# Patient Record
Sex: Male | Born: 1975 | Hispanic: Yes | Marital: Married | Smoking: Never smoker
Health system: Southern US, Community
[De-identification: ages and names within clinical notes are randomized; demographics above are authoritative.]

---

## 2009-11-24 ENCOUNTER — Encounter: Admission: RE | Admit: 2009-11-24 | Discharge: 2009-11-24 | Payer: Self-pay | Admitting: Internal Medicine

## 2010-05-18 ENCOUNTER — Encounter: Admission: RE | Admit: 2010-05-18 | Discharge: 2010-05-18 | Payer: Self-pay | Admitting: Internal Medicine

## 2010-07-01 ENCOUNTER — Encounter: Admission: RE | Admit: 2010-07-01 | Discharge: 2010-07-01 | Payer: Self-pay | Admitting: Urology

## 2012-04-03 IMAGING — CT CT ABD-PEL WO/W CM
2 of 7 series · 14 of 46 positions shown, 19 images · IV contrast ([ID] h2o & OMNI 300/[ID])
Comparison: Abdominal ultrasound 05/18/2010.

CLINICAL DATA: Intermittent hematuria over the past 2 years with
recent recurrence. Mild right flank pain.

CT ABDOMEN AND PELVIS WITHOUT AND WITH CONTRAST 07/01/2010:
TECHNIQUE: Multidetector CT imaging of the abdomen and pelvis was
performed without contrast material in one or both body regions,
followed by contrast material(s) and further sections in one or
both body regions.  Enhanced images were obtained with the patient
prone.
Contrast: 150 ml Omnipaque 300 IV.

[Series 5: enhanced/prone · axial · 0.70mm/px · z∈[-378,-48]mm · 11 of 78 slices shown, 16 images]
[im 6/78  soft-tissue]
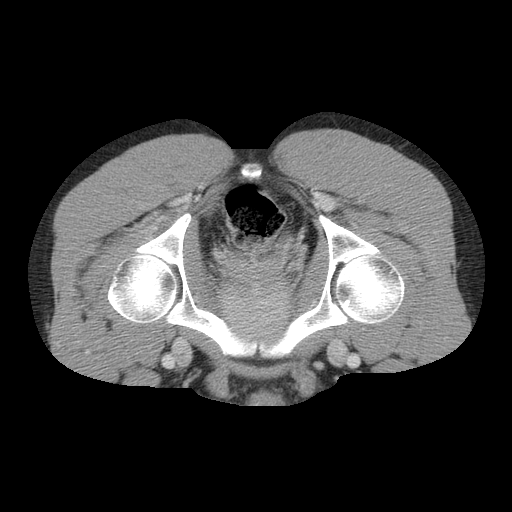
[im 6/78  bone]
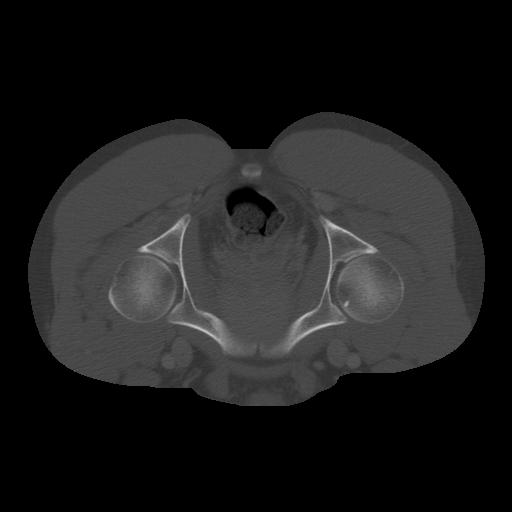
[im 16/78  soft-tissue]
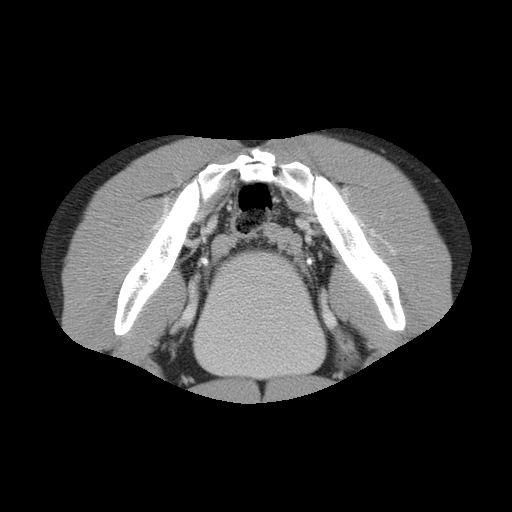
[im 21/78  soft-tissue]
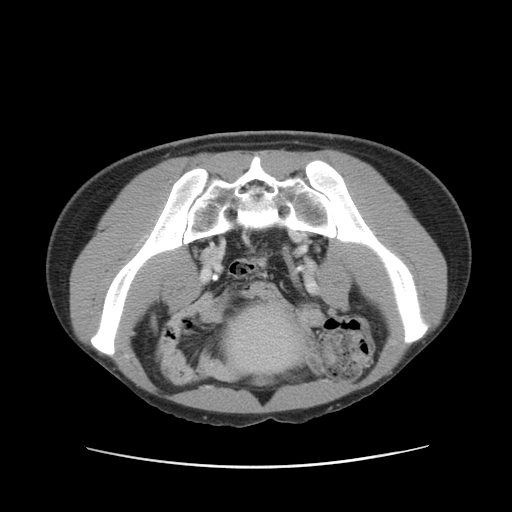
[im 26/78  soft-tissue]
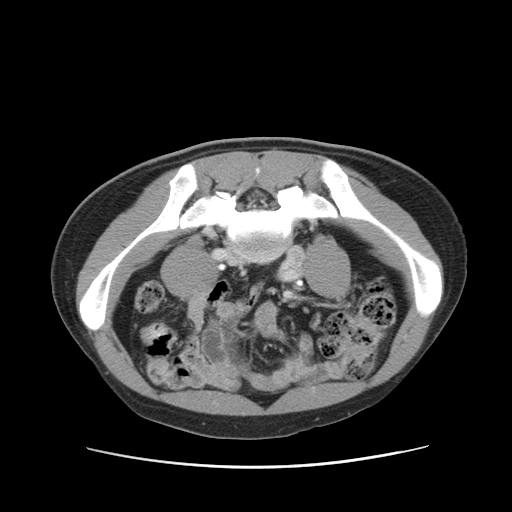
[im 36/78  soft-tissue]
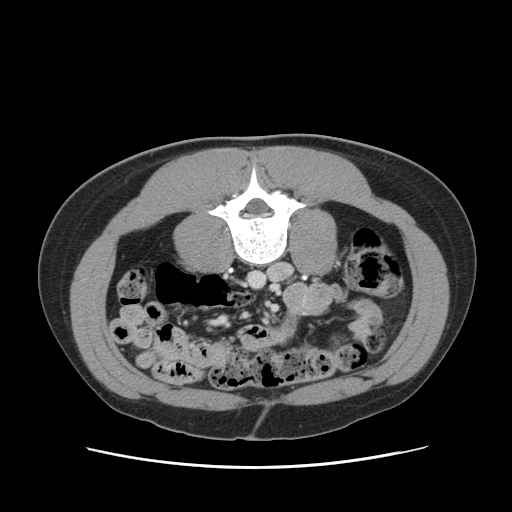
[im 42/78  soft-tissue]
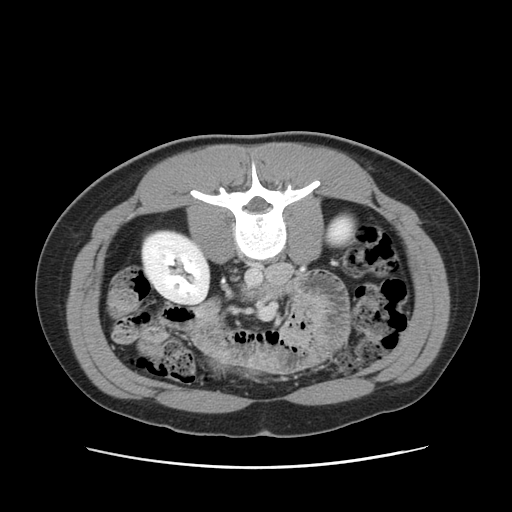
[im 52/78  soft-tissue]
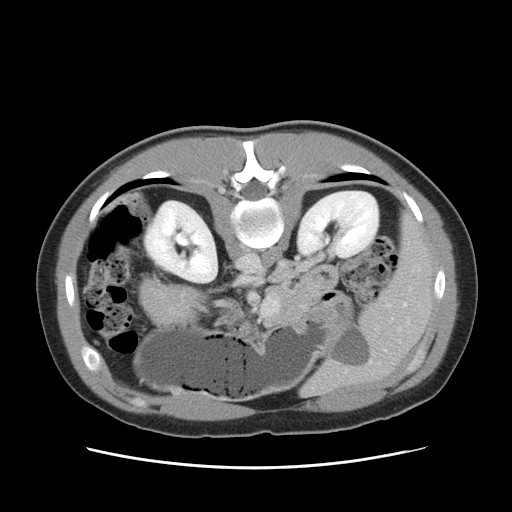
[im 57/78  soft-tissue]
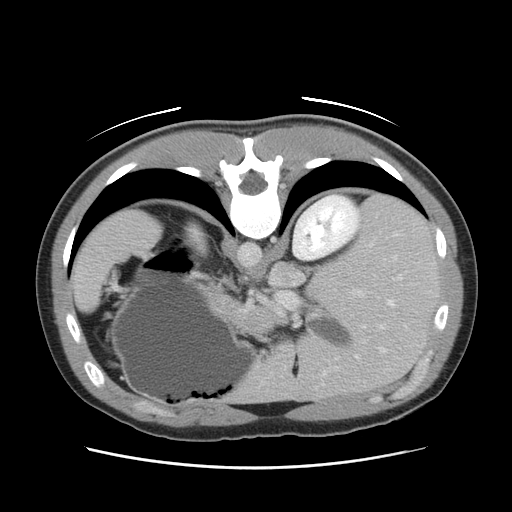
[im 57/78  lung]
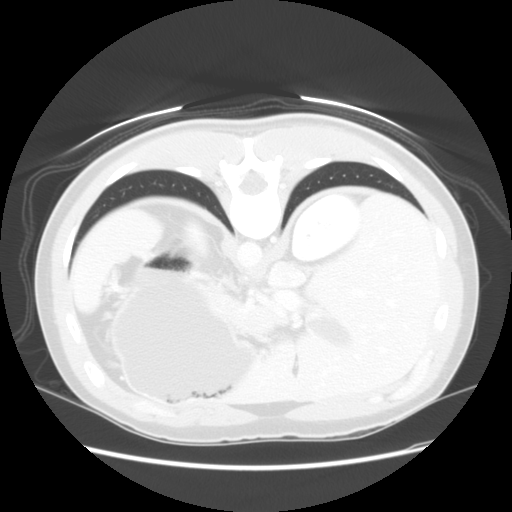
[im 62/78  soft-tissue]
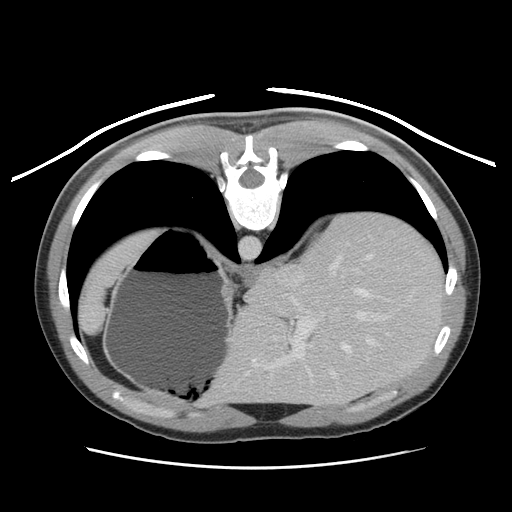
[im 62/78  lung]
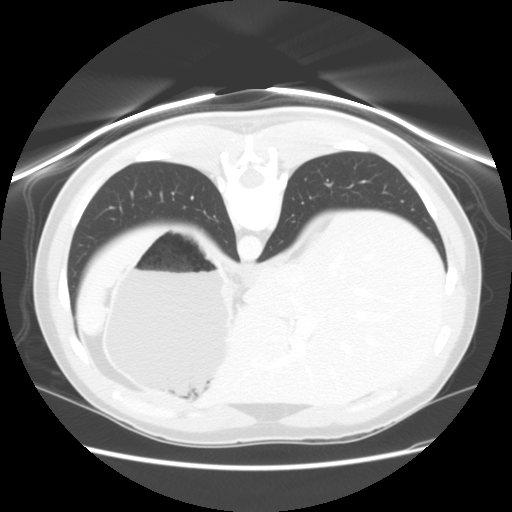
[im 62/78  bone]
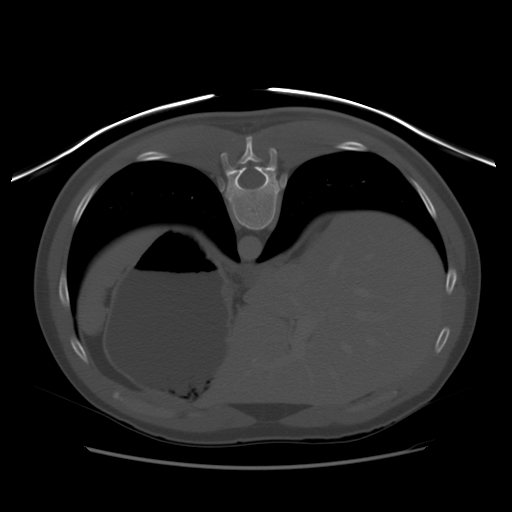
[im 67/78  lung]
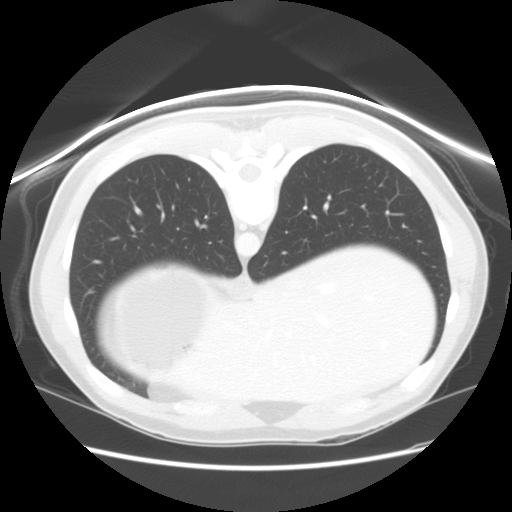
[im 72/78  soft-tissue]
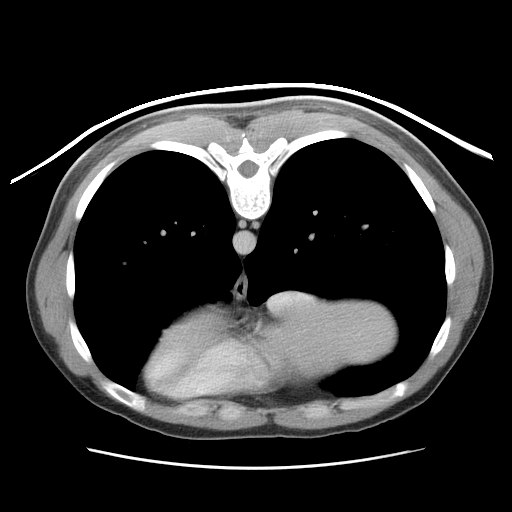
[im 72/78  lung]
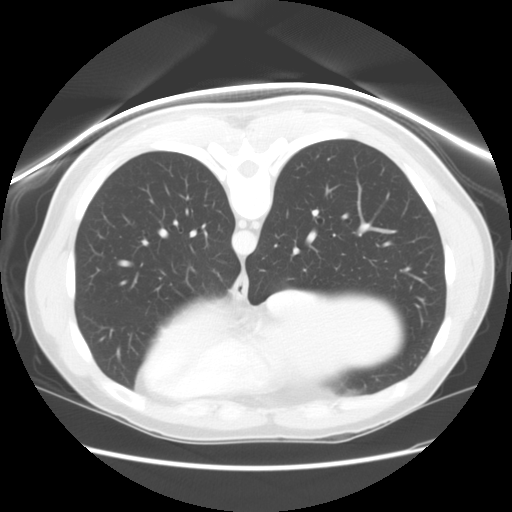

[Series 301: cor w/o · coronal · non-contrast · 0.70mm/px · 3 of 98 slices shown]
[im 25/98  soft-tissue]
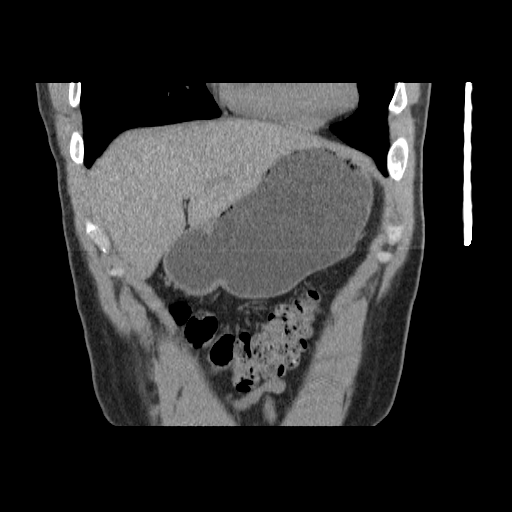
[im 49/98  soft-tissue]
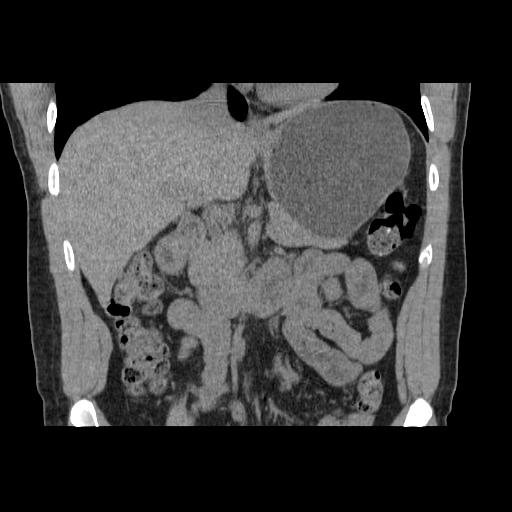
[im 73/98  soft-tissue]
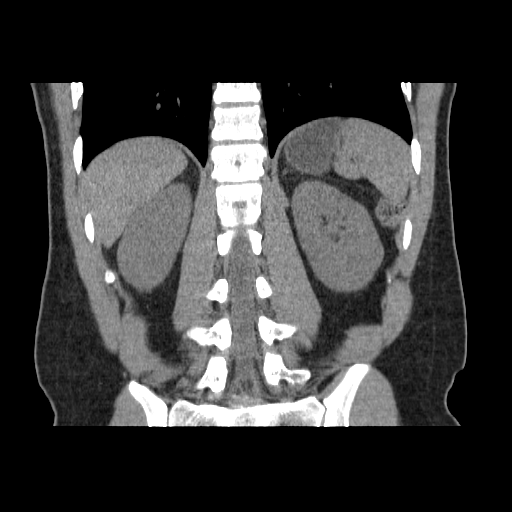

[14 of 46 positions shown; findings below may reference images not displayed]

FINDINGS: Unenhanced images of the abdomen demonstrate no upper
urinary tract calculi on either side.  No abnormal calcifications
were identified elsewhere.

Enhanced images demonstrate normal appearing kidneys bilaterally
without focal parenchymal abnormalities.  Urinary bladder normal in
appearance.

Normal appearing liver, spleen, pancreas, and adrenal glands.
Gallbladder unremarkable by CT.  No biliary ductal dilation.
Stomach, small bowel, and colon unremarkable. Normal appearing long
appendix identified in the right upper and mid pelvis.  No ascites.
No visible aorto-iliofemoral atherosclerosis.  No significant
lymphadenopathy.  Circumaortic left renal vein noted.

Prostate gland normal in appearance.  Small bilateral seminal
vesicle cysts. Visualized lung bases clear.  Bone window images
unremarkable.
IMPRESSION: 1.  No evidence of urinary tract calculi or obstruction on either
side.  No abnormalities identified to explain hematuria.  Normal-
appearing kidneys and urinary bladder.
2.  No acute or significant abnormality involving the abdomen or
pelvis.

## 2013-03-20 ENCOUNTER — Encounter (HOSPITAL_COMMUNITY): Payer: Self-pay | Admitting: *Deleted

## 2013-03-20 DIAGNOSIS — K219 Gastro-esophageal reflux disease without esophagitis: Secondary | ICD-10-CM | POA: Insufficient documentation

## 2013-03-20 NOTE — ED Notes (Addendum)
Pt states last night he had burning after he ate. Pt states that he has pain in his lower center chest, near stomach area. Pt states first time this pain has ever happened. Pt denies lifting anything heavy. Pt denies eating new foods. Pt states when he sneezes, coughs or takes a deep breath the pain gets worse.

## 2013-03-21 ENCOUNTER — Emergency Department (HOSPITAL_COMMUNITY)
Admission: EM | Admit: 2013-03-21 | Discharge: 2013-03-21 | Disposition: A | Discharge status: Home / Self Care | Payer: Self-pay | Attending: Emergency Medicine | Admitting: Emergency Medicine

## 2013-03-21 ENCOUNTER — Emergency Department (HOSPITAL_COMMUNITY)
Admit: 2013-03-21 | Discharge: 2013-03-21 | Disposition: A | Discharge status: Home / Self Care | Payer: Self-pay | Attending: Emergency Medicine | Admitting: Emergency Medicine

## 2013-03-21 DIAGNOSIS — K219 Gastro-esophageal reflux disease without esophagitis: Secondary | ICD-10-CM

## 2013-03-21 LAB — CBC WITH DIFFERENTIAL/PLATELET
Basophils Absolute: 0 10*3/uL (ref 0.0–0.1)
Eosinophils Absolute: 0.3 10*3/uL (ref 0.0–0.7)
Eosinophils Relative: 5 % (ref 0–5)
MCH: 30.7 pg (ref 26.0–34.0)
MCV: 88.2 fL (ref 78.0–100.0)
Platelets: 243 10*3/uL (ref 150–400)
RDW: 13.8 % (ref 11.5–15.5)
WBC: 5.2 10*3/uL (ref 4.0–10.5)

## 2013-03-21 LAB — COMPREHENSIVE METABOLIC PANEL
ALT: 53 U/L (ref 0–53)
AST: 65 U/L — ABNORMAL HIGH (ref 0–37)
Albumin: 3.3 g/dL — ABNORMAL LOW (ref 3.5–5.2)
Calcium: 8.9 mg/dL (ref 8.4–10.5)
GFR calc Af Amer: 74 mL/min — ABNORMAL LOW (ref >90)
Sodium: 138 mEq/L (ref 135–145)
Total Protein: 7.1 g/dL (ref 6.0–8.3)

## 2013-03-21 MED ORDER — GI COCKTAIL ~~LOC~~
30.0000 mL | Freq: Once | ORAL | Status: AC
  Administered 2013-03-21: 30 mL via ORAL
  Filled 2013-03-21: qty 30

## 2013-03-21 MED ORDER — SUCRALFATE 1 GM/10ML PO SUSP: 1.0000 g | Freq: Four times a day (QID) | ORAL | Status: AC

## 2013-03-21 MED ORDER — OMEPRAZOLE 20 MG PO CPDR: 20.0000 mg | DELAYED_RELEASE_CAPSULE | Freq: Every day | ORAL | Status: AC

## 2013-03-21 NOTE — ED Notes (Signed)
Pt states understanding of discharge instructions 

## 2013-03-21 NOTE — ED Provider Notes (Signed)
History     CSN: [id number]  Arrival date & time 03/20/13  2341   First MD Initiated Contact with Patient 03/21/13 [phone]      Chief Complaint  Patient presents with  . Chest Pain    (Consider location/radiation/quality/duration/timing/severity/associated sxs/prior treatment) Patient is a 37 y.o. male presenting with chest pain. The history is provided by the patient.  Chest Pain Pain location:  Epigastric Pain quality: burning   Pain radiates to:  Does not radiate Pain radiates to the back: no   Pain severity:  Moderate Onset quality:  Gradual Duration:  36 hours Timing:  Constant Progression:  Unchanged Chronicity:  New Context: at rest   Relieved by:  Nothing Worsened by:  Nothing tried Ineffective treatments:  None tried Associated symptoms: no fever, no palpitations and no shortness of breath   Risk factors: male sex     History reviewed. No pertinent past medical history.  History reviewed. No pertinent past surgical history.  History reviewed. No pertinent family history.  History  Substance Use Topics  . Smoking status: Never Smoker   . Smokeless tobacco: Not on file  . Alcohol Use: No      Review of Systems  Constitutional: Negative for fever.  Respiratory: Negative for shortness of breath.   Cardiovascular: Positive for chest pain. Negative for palpitations and leg swelling.  All other systems reviewed and are negative.    Allergies  Review of patient's allergies indicates no known allergies.  Home Medications  No current outpatient prescriptions on file.  BP 126/81  Pulse 86  Resp 16  SpO2 98%  Physical Exam  Constitutional: He is oriented to person, place, and time. He appears well-developed and well-nourished. No distress.  HENT:  Head: Normocephalic and atraumatic.  Mouth/Throat: Oropharynx is clear and moist.  Eyes: Conjunctivae are normal. Pupils are equal, round, and reactive to light.  Neck: Normal range of motion. Neck  supple.  Cardiovascular: Normal rate, regular rhythm and intact distal pulses.   Pulmonary/Chest: Effort normal and breath sounds normal. He has no wheezes. He has no rales.  Abdominal: Soft. Bowel sounds are increased. There is no tenderness. There is no rebound and no guarding.  Musculoskeletal: Normal range of motion. He exhibits no edema.  Neurological: He is alert and oriented to person, place, and time.  Skin: Skin is warm and dry. He is not diaphoretic.  Psychiatric: He has a normal mood and affect.    ED Course  Procedures (including critical care time)  Labs Reviewed  COMPREHENSIVE METABOLIC PANEL - Abnormal; Notable for the following:    Creatinine, Ser 1.40 (*)    Albumin 3.3 (*)    AST 65 (*)    Total Bilirubin 0.2 (*)    GFR calc non Af Amer 64 (*)    GFR calc Af Amer 74 (*)    All other components within normal limits  CBC WITH DIFFERENTIAL  POCT I-STAT TROPONIN I   Dg Chest 2 View  03/21/2013   *RADIOLOGY REPORT*  Clinical Data: Chest and epigastric pain.  CHEST - 2 VIEW  Comparison:  None.  Findings:  The heart size and mediastinal contours are within normal limits.  Both lungs are clear.  The visualized skeletal structures are unremarkable.  IMPRESSION: No active cardiopulmonary disease.   Original Report Authenticated By: Myles Rosenthal, M.D.     No diagnosis found.  PERC and wells negative   MDM   Date: 03/21/2013  Rate: 86  Rhythm: normal sinus rhythm  QRS Axis: normal  Intervals: normal  ST/T Wave abnormalities: normal  Conduction Disutrbances: none  Narrative Interpretation: unremarkable    Negative EKG and negative troponin in the setting of > 8 hours of symptoms excluded ACS.  Sx consistent with gerd       Joel Mericle Smitty Cords, MD 03/21/13 [phone]

## 2016-12-22 ENCOUNTER — Other Ambulatory Visit: Payer: Self-pay | Admitting: Internal Medicine

## 2016-12-22 ENCOUNTER — Other Ambulatory Visit: Payer: Self-pay | Admitting: Family

## 2016-12-22 DIAGNOSIS — R109 Unspecified abdominal pain: Secondary | ICD-10-CM

## 2016-12-26 ENCOUNTER — Ambulatory Visit (INDEPENDENT_AMBULATORY_CARE_PROVIDER_SITE_OTHER): Payer: BLUE CROSS/BLUE SHIELD

## 2016-12-26 DIAGNOSIS — K5900 Constipation, unspecified: Secondary | ICD-10-CM | POA: Diagnosis not present

## 2016-12-26 DIAGNOSIS — R109 Unspecified abdominal pain: Secondary | ICD-10-CM

## 2017-12-04 ENCOUNTER — Other Ambulatory Visit: Payer: Self-pay

## 2017-12-04 ENCOUNTER — Emergency Department (INDEPENDENT_AMBULATORY_CARE_PROVIDER_SITE_OTHER)
Admission: EM | Admit: 2017-12-04 | Discharge: 2017-12-04 | Disposition: A | Discharge status: Home / Self Care | Payer: BLUE CROSS/BLUE SHIELD | Source: Home / Self Care | Attending: Family Medicine | Admitting: Family Medicine

## 2017-12-04 DIAGNOSIS — R6889 Other general symptoms and signs: Secondary | ICD-10-CM

## 2017-12-04 DIAGNOSIS — Z20828 Contact with and (suspected) exposure to other viral communicable diseases: Secondary | ICD-10-CM

## 2017-12-04 MED ORDER — OSELTAMIVIR PHOSPHATE 75 MG PO CAPS: 75.0000 mg | ORAL_CAPSULE | Freq: Two times a day (BID) | ORAL | 0 refills | Status: AC

## 2017-12-04 MED ORDER — BENZONATATE 100 MG PO CAPS: 100.0000 mg | ORAL_CAPSULE | Freq: Three times a day (TID) | ORAL | 0 refills | Status: AC

## 2017-12-04 NOTE — Discharge Instructions (Signed)
°  You may take 500mg acetaminophen every 4-6 hours or in combination with ibuprofen 400-600mg every 6-8 hours as needed for pain, inflammation, and fever. ° °Be sure to drink at least eight 8oz glasses of water to stay well hydrated and get at least 8 hours of sleep at night, preferably more while sick.  ° °Oseltamivir (Tamiflu) may cause stomach upset including nausea, vomiting and diarrhea.  It may also cause dizziness or hallucinations in children.  To help prevent stomach upset, you may take this medication with food.  If you are still having unwanted symptoms, you may stop taking this medication as it is not as important to finish the entire course like antibiotics.  If you have questions/concerns please call our office or follow up with your primary care provider.   ° °

## 2017-12-04 NOTE — ED Triage Notes (Signed)
Started Friday with cough and sore throat.  Fever Friday night, as high as 101.9.

## 2017-12-04 NOTE — ED Provider Notes (Signed)
[facility]    CSN: [id number] Arrival date & time: 12/04/17  1219     History   Chief Complaint Chief Complaint  Patient presents with  . Cough  . Sore Throat  . Fever    HPI Randall Kim is a 42 y.o. male.   HPI  Randall Kim is a 42 y.o. male presenting to UC with c/o 2-3 days of mildly productive cough, sore throat, body aches and fever Tmax 101.9*F.  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 Pt did receive the flu vaccine this season.  He has been taking OTC cough/cold medications with temporary relief.  Denies n/v/d. His wife is also in UC to be seen for similar symptoms but pt's symptoms started first.  No chest pain or SOB.   History reviewed. No pertinent past medical history.  There are no active problems to display for this patient.   History reviewed. No pertinent surgical history.     Home Medications    Prior to Admission medications   Medication Sig Start Date End Date Taking? Authorizing Provider  benzonatate (TESSALON) 100 MG capsule Take 1-2 capsules (100-200 mg total) by mouth every 8 (eight) hours. 12/04/17   Lurene Shadow, PA-C  calcium carbonate (TUMS - DOSED IN MG ELEMENTAL CALCIUM) 500 MG chewable tablet Chew 2-3 tablets by mouth 3 (three) times daily as needed for heartburn.    [provider]  omeprazole (PRILOSEC) 20 MG capsule Take 1 capsule (20 mg total) by mouth daily. 03/21/13   Palumbo, April, MD  oseltamivir (TAMIFLU) 75 MG capsule Take 1 capsule (75 mg total) by mouth every 12 (twelve) hours. 12/04/17   Lurene Shadow, PA-C  sucralfate (CARAFATE) 1 GM/10ML suspension Take 10 mLs (1 g total) by mouth 4 (four) times daily. 03/21/13   Palumbo, April, MD    Family History History reviewed. No pertinent family history.  Social History Social History   Tobacco Use  . Smoking status: Never Smoker  Substance Use Topics  . Alcohol use: No  . Drug use: No     Allergies     Fruit & vegetable daily [nutritional supplements]   Review of Systems Review of Systems  Constitutional: Positive for chills, fatigue and fever.  HENT: Positive for congestion and sore throat. Negative for ear pain, trouble swallowing and voice change.   Respiratory: Positive for cough. Negative for shortness of breath.   Cardiovascular: Negative for chest pain and palpitations.  Gastrointestinal: Negative for abdominal pain, diarrhea, nausea and vomiting.  Musculoskeletal: Negative for arthralgias, back pain and myalgias.  Skin: Negative for rash.  Neurological: Positive for headaches. Negative for dizziness and light-headedness.     Physical Exam Triage Vital Signs ED Triage Vitals  Enc Vitals Group     BP 12/04/17 1258 124/82     Pulse Rate 12/04/17 1258 93     Resp --      Temp 12/04/17 1258 99.2 F (37.3 C)     Temp Source 12/04/17 1258 Oral     SpO2 12/04/17 1258 96 %     Weight 12/04/17 1307 169 lb (76.7 kg)     Height 12/04/17 1307 5\' 6"  (1.676 m)     Head Circumference --      Peak Flow --      Pain Score 12/04/17 1306 0     Pain Loc --      Pain Edu? --  Excl. in GC? --    No data found.  Updated Vital Signs BP 124/82 (BP Location: Right Arm)   Pulse 93   Temp 99.2 F (37.3 C) (Oral)   Ht 5\' 6"  (1.676 m)   Wt 169 lb (76.7 kg)   SpO2 96%   BMI 27.28 kg/m   Visual Acuity Right Eye Distance:   Left Eye Distance:   Bilateral Distance:    Right Eye Near:   Left Eye Near:    Bilateral Near:     Physical Exam  Constitutional: He is oriented to person, place, and time. He appears well-developed and well-nourished.  Non-toxic appearance. He does not appear ill. No distress.  HENT:  Head: Normocephalic and atraumatic.  Right Ear: Tympanic membrane normal.  Left Ear: Tympanic membrane normal.  Nose: Mucosal edema present. Right sinus exhibits no maxillary sinus tenderness and no frontal sinus tenderness. Left sinus exhibits no maxillary sinus  tenderness and no frontal sinus tenderness.  Mouth/Throat: Uvula is midline, oropharynx is clear and moist and mucous membranes are normal.  Eyes: EOM are normal.  Neck: Normal range of motion. Neck supple.  Cardiovascular: Normal rate and regular rhythm.  Pulmonary/Chest: Effort normal and breath sounds normal. No stridor. No respiratory distress. He has no wheezes. He has no rhonchi. He has no rales. He exhibits no tenderness.  Musculoskeletal: Normal range of motion.  Lymphadenopathy:    He has no cervical adenopathy.  Neurological: He is alert and oriented to person, place, and time.  Skin: Skin is warm and dry.  Psychiatric: He has a normal mood and affect. His behavior is normal.  Nursing note and vitals reviewed.    UC Treatments / Results  Labs (all labs ordered are listed, but only abnormal results are displayed) Labs Reviewed - No data to display  EKG  EKG Interpretation None       Radiology No results found.  Procedures Procedures (including critical care time)  Medications Ordered in UC Medications - No data to display   Initial Impression / Assessment and Plan / UC Course  I have reviewed the triage vital signs and the nursing notes.  Pertinent labs & imaging results that were available during my care of the patient were reviewed by me and considered in my medical decision making (see chart for details).     Hx and exam c/w influenza w/o evidence of underlying bacterial infection Encouraged fluids and rest Continue OTC medications for symptomatic treatment  Along with starting Tamiflu today F/u with PCP in 1 week if needed Work note provided for pt to return Thursday, 2/7.  Final Clinical Impressions(s) / UC Diagnoses   Final diagnoses:  Flu-like symptoms  Exposure to the flu    ED Discharge Orders        Ordered    oseltamivir (TAMIFLU) 75 MG capsule  Every 12 hours     12/04/17 1316    benzonatate (TESSALON) 100 MG capsule  Every 8 hours      12/04/17 1316       Controlled Substance Prescriptions [place] Controlled Substance Registry consulted? Not Applicable   Rolla Platehelps, Annalicia Renfrew O, PA-C 12/04/17 1404

## 2018-09-29 IMAGING — CT CT RENAL STONE PROTOCOL
2 of 4 series · 15 of 46 positions shown, 17 images · non-contrast
Comparison: CT scan 07/01/2010

CLINICAL DATA: Right flank pain for 1 year

EXAM:
CT ABDOMEN AND PELVIS WITHOUT CONTRAST
TECHNIQUE: Multidetector CT imaging of the abdomen and pelvis was performed
following the standard protocol without IV contrast.

[Series 2: axial st · axial · 0.85mm/px · z∈[-483,-63]mm · 12 of 96 slices shown, 14 images]
[im 8/96  soft-tissue]
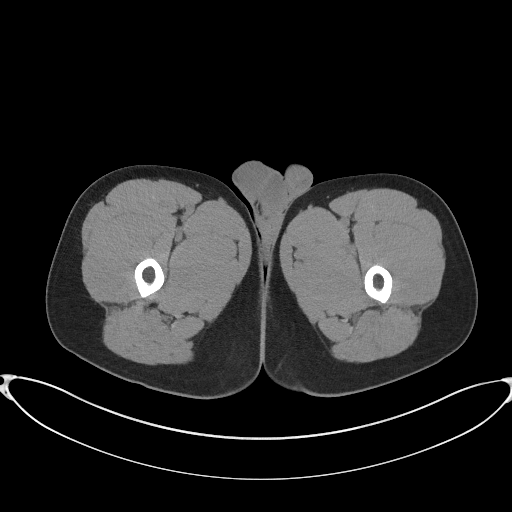
[im 8/96  bone]
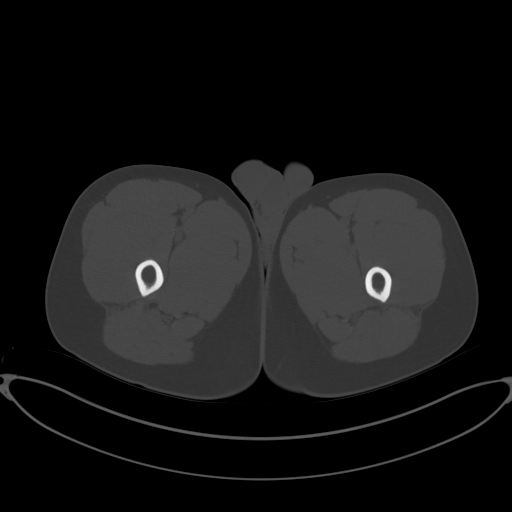
[im 16/96  soft-tissue]
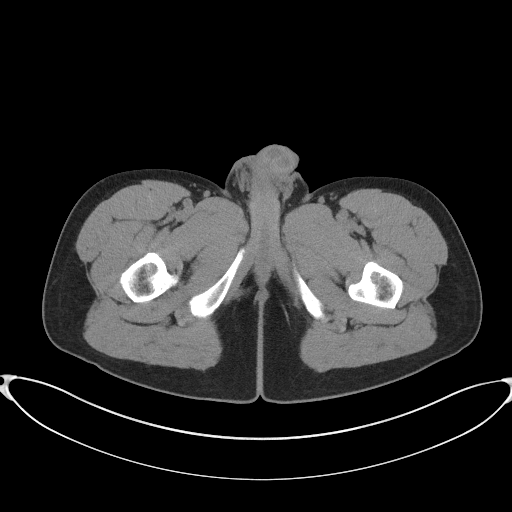
[im 23/96  soft-tissue]
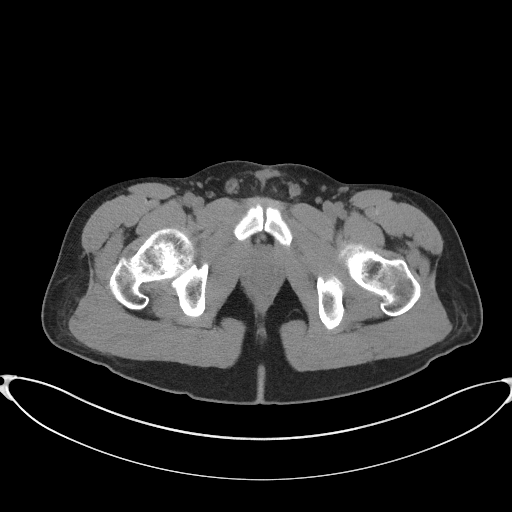
[im 31/96  soft-tissue]
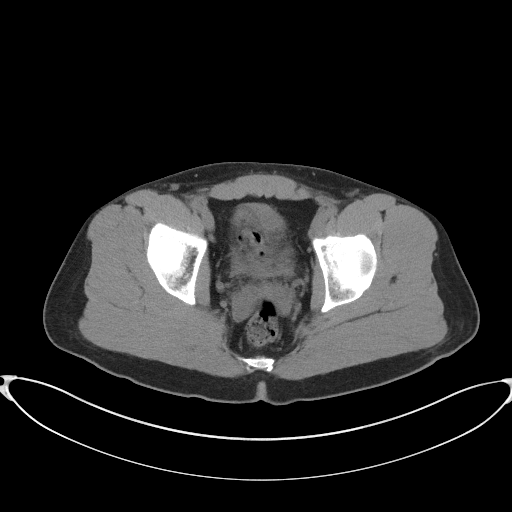
[im 39/96  soft-tissue]
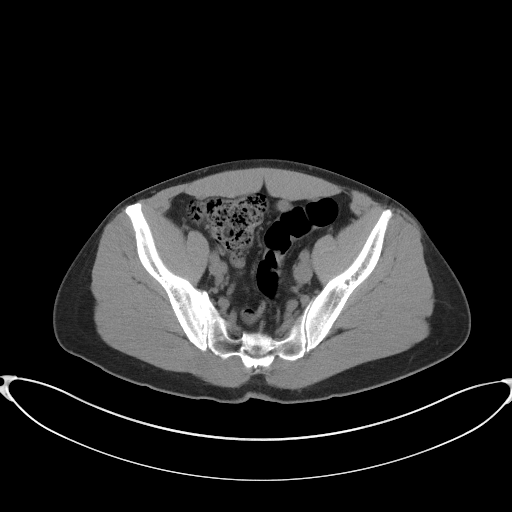
[im 46/96  soft-tissue]
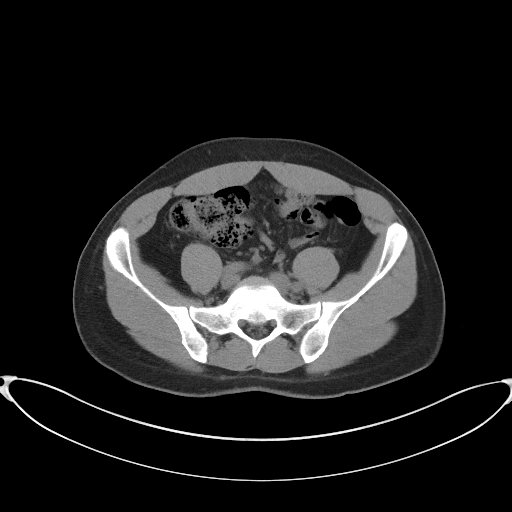
[im 54/96  soft-tissue]
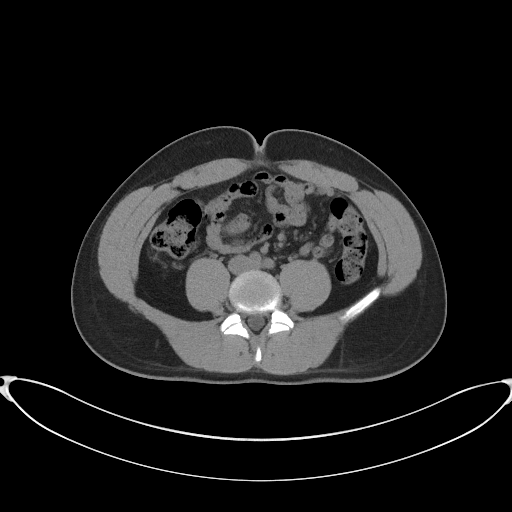
[im 61/96  soft-tissue]
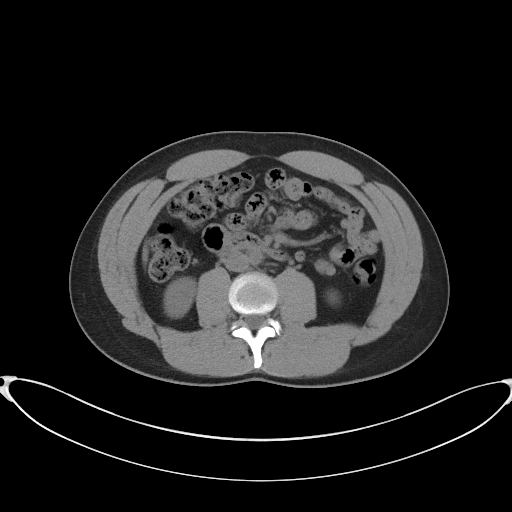
[im 69/96  soft-tissue]
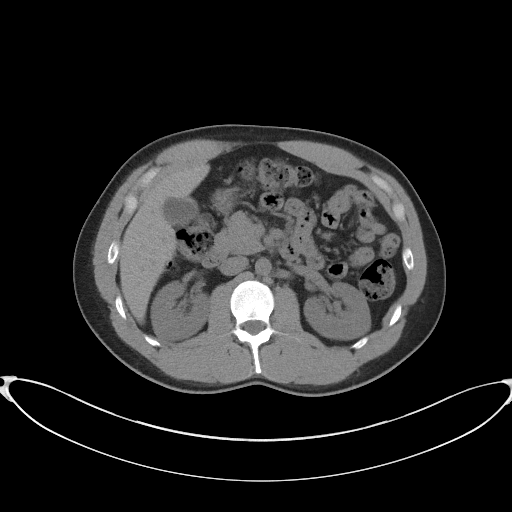
[im 69/96  bone]
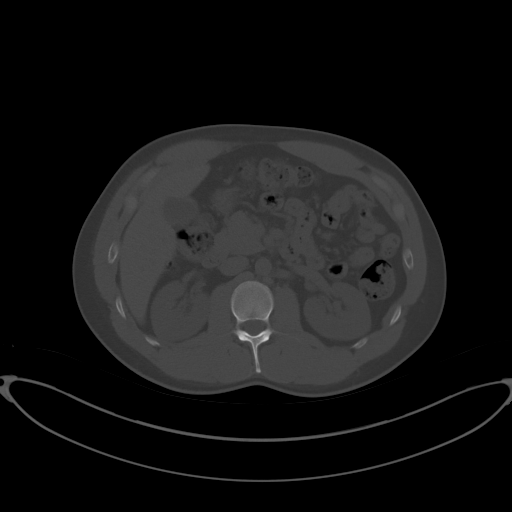
[im 77/96  soft-tissue]
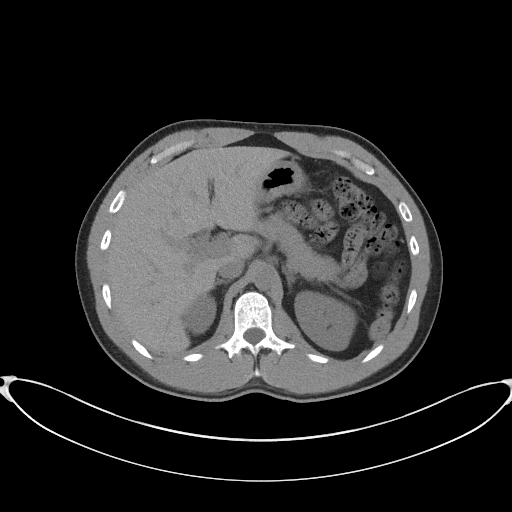
[im 84/96  soft-tissue]
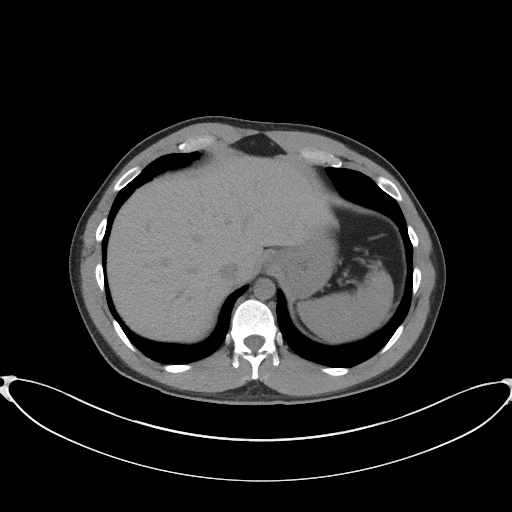
[im 92/96  soft-tissue]
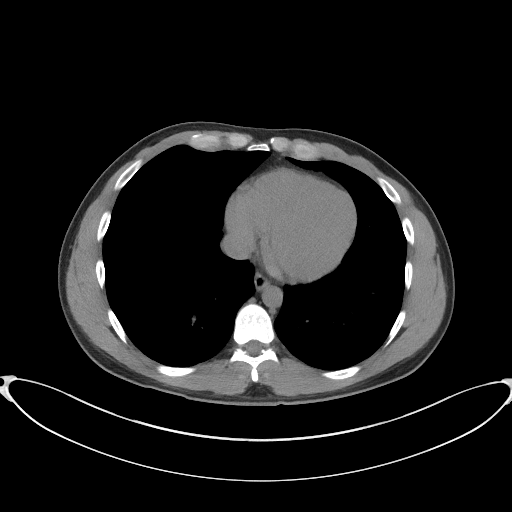

[Series 4: coronal st · coronal · 0.72mm/px · 3 of 83 slices shown]
[im 28/83  soft-tissue]
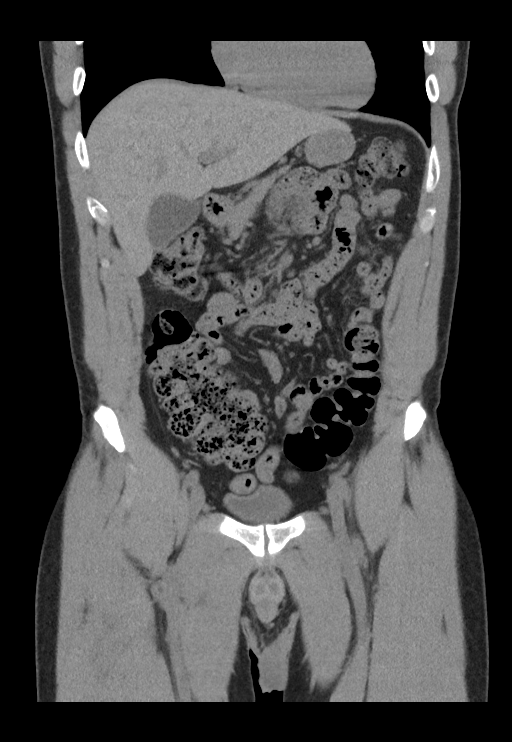
[im 37/83  soft-tissue]
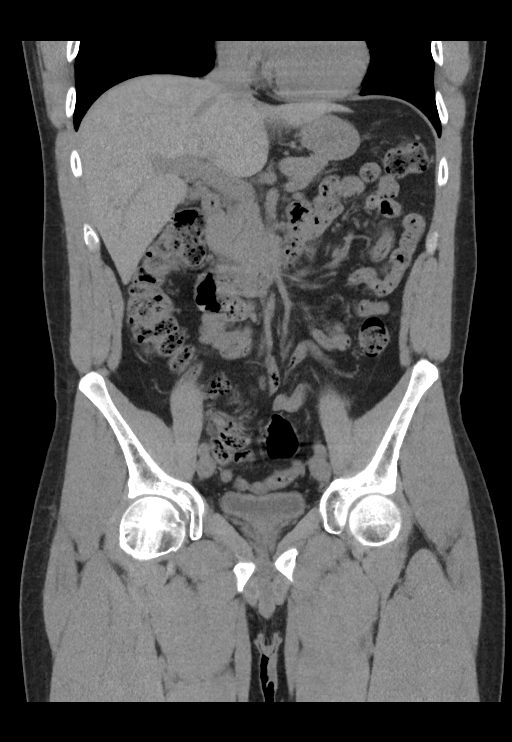
[im 46/83  soft-tissue]
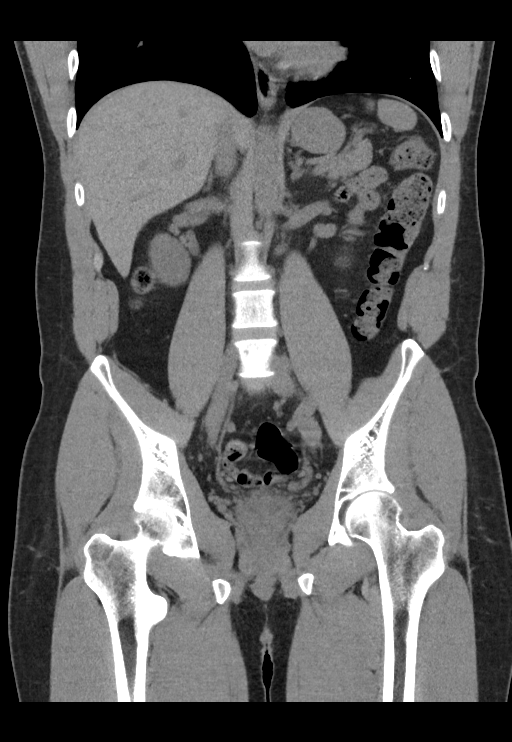

[15 of 46 positions shown; findings below may reference images not displayed]

FINDINGS: Lower chest: Lung bases shows no acute findings

Hepatobiliary: Unenhanced liver shows no biliary ductal dilatation.
No calcified gallstones are noted within gallbladder.

Pancreas: Unenhanced pancreas with normal appearance. No surrounding
inflammatory changes.

Spleen: Unenhanced spleen with normal appearance.

Adrenals/Urinary Tract: No adrenal gland mass. Unenhanced kidneys
are symmetrical in size. No nephrolithiasis. No hydronephrosis or
hydroureter. No calcified ureteral calculi are noted. The urinary
bladder is under distended limiting its assessment. No calcified
calculi are noted within urinary bladder.

Stomach/Bowel: There is no gastric outlet obstruction. No small
bowel obstruction. No thickened or dilated small bowel loops.
Moderate stool noted throughout the colon. There is a low lying
cecum. Abundant stool noted within cecum. No pericecal inflammation.
Terminal ileum is unremarkable. Normal retrocecal appendix partially
visualized in axial image 56. No evidence of acute colitis or
diverticulitis. No distal colonic obstruction. Some colonic gas
noted in proximal sigmoid colon.

Vascular/Lymphatic: No aortic aneurysm. No retroperitoneal or
mesenteric adenopathy.

Reproductive: No pelvic mass. Prostate gland and seminal vesicles
are unremarkable.

Other: No ascites or free abdominal air.

Musculoskeletal: No destructive bony lesions are noted. Sagittal
images of the spine are unremarkable.
IMPRESSION: 1. No nephrolithiasis.  No hydronephrosis or hydroureter.
2. No calcified ureteral calculi. Limited assessment of the urinary
bladder which is under distended.
3. Moderate stool noted throughout the colon. Abundant stool noted
within cecum. There is a low lying cecum. No pericecal inflammation.
Normal retrocecal appendix is noted.
4. No evidence of colitis or diverticulitis. No evidence of small
bowel obstruction.
